# Patient Record
Sex: Female | Born: 1960 | Race: Black or African American | Hispanic: No | Smoking: Never smoker
Health system: Southern US, Community
[De-identification: ages and names within clinical notes are randomized; demographics above are authoritative.]

## PROBLEM LIST (undated history)

## (undated) DIAGNOSIS — E119 Type 2 diabetes mellitus without complications: Secondary | ICD-10-CM

## (undated) DIAGNOSIS — I10 Essential (primary) hypertension: Secondary | ICD-10-CM

## (undated) HISTORY — DX: Type 2 diabetes mellitus without complications: E11.9

---

## 2016-12-07 DIAGNOSIS — M75101 Unspecified rotator cuff tear or rupture of right shoulder, not specified as traumatic: Secondary | ICD-10-CM | POA: Insufficient documentation

## 2016-12-07 DIAGNOSIS — M12811 Other specific arthropathies, not elsewhere classified, right shoulder: Secondary | ICD-10-CM | POA: Insufficient documentation

## 2017-02-14 DIAGNOSIS — Z471 Aftercare following joint replacement surgery: Secondary | ICD-10-CM | POA: Insufficient documentation

## 2017-02-14 DIAGNOSIS — Z96611 Presence of right artificial shoulder joint: Secondary | ICD-10-CM

## 2017-02-14 DIAGNOSIS — Z9660 Presence of unspecified orthopedic joint implant: Secondary | ICD-10-CM | POA: Insufficient documentation

## 2017-03-04 ENCOUNTER — Ambulatory Visit: Payer: Medicaid Other | Admitting: Podiatry

## 2017-03-15 ENCOUNTER — Ambulatory Visit: Payer: Medicaid Other | Admitting: Podiatry

## 2017-03-15 DIAGNOSIS — M7542 Impingement syndrome of left shoulder: Secondary | ICD-10-CM | POA: Insufficient documentation

## 2017-03-23 ENCOUNTER — Ambulatory Visit (INDEPENDENT_AMBULATORY_CARE_PROVIDER_SITE_OTHER): Payer: Medicaid Other

## 2017-03-23 ENCOUNTER — Ambulatory Visit (INDEPENDENT_AMBULATORY_CARE_PROVIDER_SITE_OTHER): Payer: Medicaid Other | Admitting: Podiatry

## 2017-03-23 ENCOUNTER — Ambulatory Visit: Payer: Medicaid Other

## 2017-03-23 DIAGNOSIS — S9032XA Contusion of left foot, initial encounter: Secondary | ICD-10-CM

## 2017-03-23 DIAGNOSIS — S9031XA Contusion of right foot, initial encounter: Secondary | ICD-10-CM

## 2017-03-23 DIAGNOSIS — M722 Plantar fascial fibromatosis: Secondary | ICD-10-CM

## 2017-03-23 DIAGNOSIS — M67471 Ganglion, right ankle and foot: Secondary | ICD-10-CM

## 2017-03-23 NOTE — Progress Notes (Signed)
   Subjective:    Patient ID: Deanna Fuller, female    DOB: [birth year], 56 y.o.   MRN: [id number]  HPI: She presents today with chief complaint of pain to the dorsal aspect of the right foot times about 2 months now states that she had dropped a laptop on the top of her foot 2 months ago went to [facility] they state there was nothing broken but has since developed a painful nodule to the top. She states that she also has medial longitudinal arch pain and lateral foot pain. She states there is some discomfort with walking. She's not taking medications.  Review of Systems     Objective:   Physical Exam: Vital signs are stable alert and oriented 3. Pulses are palpable. Neurologic sensorium is intact. Deep tendon reflexes are intact. Muscle strength is 5 over 5 dorsally splint flexors and inverters everters on physical musculatures intact. Orthopedic evaluation demonstrates pain on palpation fluctuant nodule to the dorsal aspect at the base of the second metatarsal intermediate cuneiform joint. This measures about 1 cm in diameter and appears to be nonpulsatile and appears to be fluctuant like a ganglion cyst. Radiographs do not demonstrate any type of osseus abnormalities in this area on what appears to be a possible healing fracture at the base of the second metatarsal this is very discrete and most likely would not have seen it on general radiography initially. She does have pain on palpation medial calcaneal tubercle associate with a soft tissue increased density at the plantar fascia insertion site indicative of plantar fasciitis.      Assessment & Plan:  Assessment: Contusion possible healing fracture second metatarsal base right foot ganglion cyst dorsal aspect right foot plantar fasciitis right foot.  Plan: I injected the right heel today with a long local anesthetic discussed the possible need for aspiration or surgical excision of the ganglion. She understands is amenable to it all  follow up with me on an as-needed basis to discuss these items.

## 2017-05-10 DIAGNOSIS — M75121 Complete rotator cuff tear or rupture of right shoulder, not specified as traumatic: Secondary | ICD-10-CM | POA: Insufficient documentation

## 2018-01-23 DIAGNOSIS — M7520 Bicipital tendinitis, unspecified shoulder: Secondary | ICD-10-CM | POA: Insufficient documentation

## 2018-02-01 ENCOUNTER — Encounter: Payer: Self-pay | Admitting: Podiatry

## 2018-02-01 ENCOUNTER — Ambulatory Visit: Payer: Medicaid Other | Admitting: Podiatry

## 2018-02-01 DIAGNOSIS — S90222A Contusion of left lesser toe(s) with damage to nail, initial encounter: Secondary | ICD-10-CM | POA: Diagnosis not present

## 2018-02-01 DIAGNOSIS — L603 Nail dystrophy: Secondary | ICD-10-CM

## 2018-02-01 NOTE — Patient Instructions (Signed)

## 2018-02-01 NOTE — Progress Notes (Signed)
  Subjective:  Patient ID: Deanna Fuller, female    DOB: [birth year],  MRN: [id number] HPI Chief Complaint  Patient presents with  . Nail Problem    Hallux nail right   "I have a thick, dark nail that I just noticed after taking off my nail polish"  . Toe Pain    2nd toe left - stumped toe and darkened area at base of nail    57 y.o. female presents with the above complaint.   ROS: Denies fever chills nausea vomiting muscle aches pains calf pain back pain chest pain shortness of breath.  No past medical history on file.   Current Outpatient Medications:  .  ACCU-CHEK FASTCLIX LANCETS MISC, U TO TEST BLOOD SUGAR TID UTD, Disp: , Rfl: 3 .  ACCU-CHEK GUIDE test strip, TEST BLOOD SUGAR TID UTD, Disp: , Rfl: 2 .  atorvastatin (LIPITOR) 20 MG tablet, TK 1 T PO HS FOR HIGH CHOLESTEROL, Disp: , Rfl: 3 .  lisinopril (PRINIVIL,ZESTRIL) 10 MG tablet, lisinopril 10 mg tablet  TK 1 T PO D FOR HIGH BP, Disp: , Rfl:  .  meloxicam (MOBIC) 15 MG tablet, TK 1 T PO QD, Disp: , Rfl: 2 .  metFORMIN (GLUCOPHAGE) 500 MG tablet, Take by mouth., Disp: , Rfl:  .  NIFEdipine (PROCARDIA-XL/ADALAT CC) 60 MG 24 hr tablet, Take by mouth., Disp: , Rfl:  .  PREVIDENT 5000 SENSITIVE 1.1-5 % PSTE, , Disp: , Rfl: 3  Allergies  Allergen Reactions  . No Known Allergies    Review of Systems Objective:  There were no vitals filed for this visit.  General: Well developed, nourished, in no acute distress, alert and oriented x3   Dermatological: Skin is warm, dry and supple bilateral. Nails x 10 are well maintained; remaining integument appears unremarkable at this time. There are no open sores, no preulcerative lesions, no rash or signs of infection present.  Thick soft thick hallux nail plate left.  Discoloration.  Subungual hematoma second left.  Vascular: Dorsalis Pedis artery and Posterior Tibial artery pedal pulses are 2/4 bilateral with immedate capillary fill time. Pedal hair growth present. No varicosities  and no lower extremity edema present bilateral.   Neruologic: Grossly intact via light touch bilateral. Vibratory intact via tuning fork bilateral. Protective threshold with Semmes Wienstein monofilament intact to all pedal sites bilateral. Patellar and Achilles deep tendon reflexes 2+ bilateral. No Babinski or clonus noted bilateral.   Musculoskeletal: No gross boney pedal deformities bilateral. No pain, crepitus, or limitation noted with foot and ankle range of motion bilateral. Muscular strength 5/5 in all groups tested bilateral.  Gait: Unassisted, Nonantalgic.    Radiographs:  None taken  Assessment & Plan:   Assessment: Nail dystrophy  Plan: Samples were taken today for pathologic evaluation.     Max T. Newberg, [place]

## 2018-03-06 ENCOUNTER — Encounter: Payer: Self-pay | Admitting: Podiatry

## 2018-03-06 ENCOUNTER — Ambulatory Visit: Payer: Medicaid Other | Admitting: Podiatry

## 2018-03-06 DIAGNOSIS — S90222S Contusion of left lesser toe(s) with damage to nail, sequela: Secondary | ICD-10-CM | POA: Diagnosis not present

## 2018-03-06 DIAGNOSIS — L603 Nail dystrophy: Secondary | ICD-10-CM

## 2018-03-06 DIAGNOSIS — Z79899 Other long term (current) drug therapy: Secondary | ICD-10-CM

## 2018-03-06 MED ORDER — TERBINAFINE HCL 250 MG PO TABS: 250.0000 mg | ORAL_TABLET | Freq: Every day | ORAL | 0 refills | Status: DC

## 2018-03-06 NOTE — Patient Instructions (Signed)

## 2018-03-06 NOTE — Progress Notes (Signed)
She presents today for follow-up of her pathology reports regarding hallux nail plate right.  Objective: Vital signs are stable she is alert and oriented x3.  Pulses are palpable.  Pathology report does demonstrate onychomycosis with a saprophytic infection.  Assessment: Onychomycosis.  Plan: After thorough discussion the pros and cons of oral therapy we decided to start with terbinafine.  She will start with 250 mg tablets 1 p.o. daily and blood work will be performed today.  Should this come back abnormal we will notify her immediately.  Otherwise I will follow-up with her in 1 month.  We discussed taking at least 2 doses of the medication to make sure that the nail will start to grow out if it does not then we will consider nail removal.

## 2018-03-07 ENCOUNTER — Telehealth: Payer: Self-pay | Admitting: *Deleted

## 2018-03-07 LAB — HEPATIC FUNCTION PANEL
ALK PHOS: 77 IU/L (ref 39–117)
ALT: 15 IU/L (ref 0–32)
AST: 23 IU/L (ref 0–40)
Albumin: 5.1 g/dL (ref 3.5–5.5)
BILIRUBIN, DIRECT: 0.11 mg/dL (ref 0.00–0.40)
Bilirubin Total: 0.3 mg/dL (ref 0.0–1.2)
Total Protein: 7.9 g/dL (ref 6.0–8.5)

## 2018-03-07 NOTE — Telephone Encounter (Signed)
Left message informing pt of Dr. Hyatt's review of results and orders. 

## 2018-03-07 NOTE — Telephone Encounter (Signed)
-----   Message from Garrel Ridgel, [place] sent at 03/07/2018  6:47 AM EDT ----- Blood work looks good and may continue medication.

## 2018-03-22 ENCOUNTER — Telehealth: Payer: Self-pay | Admitting: Podiatry

## 2018-03-22 NOTE — Telephone Encounter (Signed)
I have an appointment on 19 August for a medication follow up. I was wondering if I could get labs ordered to have done before I come in for that appointment so he can check my liver and kidney functions. My number is [phone]. Thank you.

## 2018-03-23 NOTE — Telephone Encounter (Signed)
I tried to call patient back on all numbers listed in her chart, but they all stated "international calls are barred and hangs up"  Unable to reach patient by phone

## 2018-03-27 DIAGNOSIS — M7581 Other shoulder lesions, right shoulder: Secondary | ICD-10-CM | POA: Insufficient documentation

## 2018-04-03 ENCOUNTER — Ambulatory Visit: Payer: Medicaid Other | Admitting: Podiatry

## 2018-04-03 ENCOUNTER — Encounter: Payer: Self-pay | Admitting: Podiatry

## 2018-04-03 DIAGNOSIS — Z79899 Other long term (current) drug therapy: Secondary | ICD-10-CM

## 2018-04-03 MED ORDER — TERBINAFINE HCL 250 MG PO TABS: 250.0000 mg | ORAL_TABLET | Freq: Every day | ORAL | 0 refills | Status: DC

## 2018-04-03 NOTE — Progress Notes (Signed)
She presents today for follow-up of her nail dystrophy.  She denies fever chills nausea vomiting muscle aches pains itching or rashes.  She states that the medication did make her just a little bit nauseous occasionally but she is going to try eating something with it instead of taking it on empty stomach.  Objective: Vital signs are stable alert and oriented x3.  Pulses are palpable.  Nail dystrophy cannot rule out onychomycosis hallux right.  Plan: Dispensed a prescription for 90 days of medication and requested a liver profile.  Follow-up with her in 3 to 4 months

## 2018-04-04 LAB — HEPATIC FUNCTION PANEL
ALK PHOS: 81 IU/L (ref 39–117)
ALT: 21 IU/L (ref 0–32)
AST: 32 IU/L (ref 0–40)
Albumin: 4.8 g/dL (ref 3.5–5.5)
BILIRUBIN TOTAL: 0.5 mg/dL (ref 0.0–1.2)
BILIRUBIN, DIRECT: 0.14 mg/dL (ref 0.00–0.40)
Total Protein: 7.7 g/dL (ref 6.0–8.5)

## 2018-04-05 ENCOUNTER — Telehealth: Payer: Self-pay | Admitting: *Deleted

## 2018-04-05 NOTE — Telephone Encounter (Signed)
-----   Message from Garrel Ridgel, [place] sent at 04/04/2018  7:12 AM EDT ----- Blood work looks great and may continue with medication.

## 2018-04-05 NOTE — Telephone Encounter (Signed)
Left message on pt's home and mobile phone informing her of Dr. Stephenie Acres review of results and orders.

## 2018-05-09 DIAGNOSIS — M19011 Primary osteoarthritis, right shoulder: Secondary | ICD-10-CM | POA: Insufficient documentation

## 2018-07-24 ENCOUNTER — Ambulatory Visit: Payer: Medicaid Other | Admitting: Podiatry

## 2018-07-24 ENCOUNTER — Encounter: Payer: Self-pay | Admitting: Podiatry

## 2018-07-24 DIAGNOSIS — L603 Nail dystrophy: Secondary | ICD-10-CM

## 2018-07-24 NOTE — Progress Notes (Signed)
She presents today for follow-up of her 120 days of Lamisil.  Denies fever chills nausea vomiting muscle aches pains or rashes.  Had no problems taking the medication.  She states that her toenail is looking much better she refers to the hallux right.  Objective: There is no erythema edema saline strange odor toenail appears to be growing out very nicely is approximately 50% grown out and clear at this point hallux right.  Assessment: Long-term therapy with Lamisil.  Wrote another 30 tablets.  She will take 1 tablet every other day for the next 2 months I will follow-up with her in 3 months.

## 2018-08-04 DIAGNOSIS — M25569 Pain in unspecified knee: Secondary | ICD-10-CM | POA: Insufficient documentation

## 2018-08-04 DIAGNOSIS — M25519 Pain in unspecified shoulder: Secondary | ICD-10-CM | POA: Insufficient documentation

## 2018-08-04 DIAGNOSIS — M19019 Primary osteoarthritis, unspecified shoulder: Secondary | ICD-10-CM | POA: Insufficient documentation

## 2018-09-19 ENCOUNTER — Ambulatory Visit: Payer: Medicaid Other | Attending: Orthopedic Surgery | Admitting: Physical Therapy

## 2018-09-19 DIAGNOSIS — G8929 Other chronic pain: Secondary | ICD-10-CM | POA: Diagnosis present

## 2018-09-19 DIAGNOSIS — M25511 Pain in right shoulder: Secondary | ICD-10-CM | POA: Insufficient documentation

## 2018-09-19 DIAGNOSIS — M19011 Primary osteoarthritis, right shoulder: Secondary | ICD-10-CM | POA: Diagnosis not present

## 2018-09-30 ENCOUNTER — Encounter: Payer: Self-pay | Admitting: Physical Therapy

## 2018-09-30 NOTE — Therapy (Signed)
[facility] [address] [place], [place], [address] Phone: [phone]   Fax:  [phone]  Physical Therapy Evaluation  Patient Details  Name: Deanna Fuller MRN: [id number] Date of Birth: [birth year] Referring Provider (PT): Dr. Harlow Mares   Encounter Date: 09/19/2018  PT End of Session - 09/30/18 1425    Visit Number  1    Number of Visits  1    Date for PT Re-Evaluation  09/20/18    PT Start Time  6387    PT Stop Time  1017    PT Time Calculation (min)  93 min    Activity Tolerance  Patient tolerated treatment well    Behavior During Therapy  [facility] for tasks assessed/performed       History reviewed. No pertinent past medical history.  History reviewed. No pertinent surgical history.  There were no vitals filed for this visit.   Subjective Assessment - 09/30/18 1423    Subjective  See FCE report.  No c/o shoulder pain at this time.      Patient Stated Goals  FCE only         [facility] PT Assessment - 09/30/18 0001      Assessment   Medical Diagnosis  R shoulder osteoarthritis/ R shoulder pain    Referring Provider (PT)  Dr. Harlow Mares    Onset Date/Surgical Date  02/03/17    Prior Therapy  yes      Prior Function   Level of Independence  Independent      Cognition   Overall Cognitive Status  Within Functional Limits for tasks assessed             Plan - 09/30/18 1425    Clinical Impression Statement  Overall Level of Work: Falls within the Light range.  Exerting up to 20 pounds of force occasionally, and/or up to 10 pounds of force frequently, and/or a negligible amount of force constantly (Constantly: activity or condition exist 2/3 or more of the time) to move objects.  Physical demand requirements are in excess of those for Sedentary Work.  Even though the weight lifted may be only a negligible amount, a job should be rated Light Work: (1) when it requires walking or standing to significant degree; or (2) when it  requires sitting most of the time but entails pushing and/or pulling of arm or leg controls; and/or (3) when the job requires working at a production rate pace entailing the constant pushing and/or pulling of materials even though the weight of those materials is negligible.  NOTE: The constant stress and strain of maintaining a production rate pace, especially in an industrial setting, can be and is physically demanding of a worker even though the amount of force exerted is negligible.  Please note that the dynamic strength/manual materials handling section of the report indicates a higher level of work than that determined by considering the client's performance on the entire test.  In this case, the overall level of work was significantly influenced by elevated heart rates in the Mobility section of the test. Our research shows that the safe, overall level of work is significantly influenced by non-materials handling (i.e. position tolerance and mobility) abilities.  To ignore these non-materials handling demands, negatively impacts the validity of the test.    Please see the Task Performance Table for specific abilities.  Based on the individual task scores in Dynamic Strength, Position Tolerance and Mobility, the client is able to tolerate the Light level  of work for the 8-hour day/40-hour week.      Clinical Presentation  Stable    Clinical Decision Making  Low    Rehab Potential  Good    PT Frequency  1x / week    PT Treatment/Interventions  ADLs/Self Care Home Management;Functional mobility training;Therapeutic activities;Therapeutic exercise;Neuromuscular re-education;Patient/family education    PT Next Visit Plan  FCE only       Patient will benefit from skilled therapeutic intervention in order to improve the following deficits and impairments:  Pain, Impaired UE functional use, Decreased strength, Decreased mobility  Visit Diagnosis: Osteoarthritis of right shoulder, unspecified  osteoarthritis type  Chronic right shoulder pain     Problem List Patient Active Problem List   Diagnosis Date Noted  . Impingement syndrome of left shoulder 03/15/2017  . 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 Joint replaced 02/14/2017  . Rotator cuff tear arthropathy of right shoulder 12/07/2016   Pura Spice, PT, DPT # [phone] 09/30/2018, 2:29 PM  [facility] [address] [place], [place], [address] Phone: [phone]   Fax:  [phone]  Name: Angila Wombles MRN: [id number] Date of Birth: [birth year]

## 2018-10-23 ENCOUNTER — Ambulatory Visit: Payer: Medicaid Other | Admitting: Podiatry

## 2018-10-25 ENCOUNTER — Other Ambulatory Visit: Payer: Self-pay

## 2018-10-25 ENCOUNTER — Ambulatory Visit: Payer: Medicaid Other | Admitting: Podiatry

## 2018-10-25 ENCOUNTER — Encounter: Payer: Self-pay | Admitting: Podiatry

## 2018-10-25 DIAGNOSIS — L603 Nail dystrophy: Secondary | ICD-10-CM

## 2018-10-25 MED ORDER — TERBINAFINE HCL 250 MG PO TABS: 250.0000 mg | ORAL_TABLET | Freq: Every day | ORAL | 0 refills | Status: DC

## 2018-10-25 NOTE — Progress Notes (Signed)
She presents today for follow-up of her nail fungus.  States that she completed 120 days +1 every other day dosing cycle states that is getting better except for that one spot in the corner.  Objective: She denies fever chills nausea body muscle aches pains calf pain back pain chest pain shortness of breath.  She has no pain on palpation of the toes.  Hallux nail appears to be growing very nicely.  There is no erythema edema cellulitis drainage odor she does a sharp incurvated nail margin with some hyperpigmentation which may need to be removed at some point.  Assessment: Well-healing onychomycosis.  Plan: I will like to follow-up with her in about 3 months and I would like for her to continue an every other day dosing of the Lamisil which was prescribed today.

## 2019-01-24 ENCOUNTER — Ambulatory Visit: Payer: Medicaid Other | Admitting: Podiatry

## 2019-01-24 ENCOUNTER — Encounter: Payer: Self-pay | Admitting: Podiatry

## 2019-01-24 ENCOUNTER — Other Ambulatory Visit: Payer: Self-pay

## 2019-01-24 VITALS — Temp 96.4°F

## 2019-01-24 DIAGNOSIS — L600 Ingrowing nail: Secondary | ICD-10-CM

## 2019-01-24 MED ORDER — NEOMYCIN-POLYMYXIN-HC 1 % OT SOLN: OTIC | 1 refills | Status: DC

## 2019-01-24 MED ORDER — TERBINAFINE HCL 250 MG PO TABS: 250.0000 mg | ORAL_TABLET | Freq: Every day | ORAL | 0 refills | Status: DC

## 2019-01-24 NOTE — Progress Notes (Signed)
She presents today chief complaint of ingrown toenail tibial border of the hallux right.  States that after the Lamisil it seems to be growing out quite nicely.  Seems like there is a small amount left right here as she points to the medial border of the hallux right.  Objective: Vital signs are stable she is alert and oriented x3.  Pulses are palpable.  She has pain on palpation with mild erythema along the tibial border of the hallux right.  There is no purulence no malodor.  The majority of her fungus has grown out nicely.  Assessment: Resolving onychomycosis.  Ingrown toenail tibial border hallux right.  Plan: Discussed etiology pathology conservative surgical therapies were going to continue the use of Lamisil 1 tablet every other day for the next 2 months.  I went ahead and performed a chemical matrixectomy today along the tibial border of the hallux right.  She tolerated procedure well after local anesthesia was administered she was provided with both oral and written home-going instructions for the care and soaking of the toe as well as a prescription for Corticosporin otic to be applied twice daily.  I will follow-up with her in 2 weeks to reevaluate the toe she will continue the use of the Lamisil.

## 2019-01-24 NOTE — Patient Instructions (Addendum)
Betadine Soak Instructions  Purchase an 8 oz. bottle of BETADINE solution (Povidone)  THE DAY AFTER THE PROCEDURE  Place 1 tablespoon of betadine solution in a quart of warm tap water.  Submerge your foot or feet with outer bandage intact for the initial soak; this will allow the bandage to become moist and wet for easy lift off.  Once you remove your bandage, continue to soak in the solution for 20 minutes.  This soak should be done twice a day.  Next, remove your foot or feet from solution, blot dry the affected area and cover.  You may use a band aid large enough to cover the area or use gauze and tape.  Apply other medications to the area as directed by the doctor such as cortisporin otic solution (ear drops) or neosporin.  IF YOUR SKIN BECOMES IRRITATED WHILE USING THESE INSTRUCTIONS, IT IS OKAY TO SWITCH TO EPSOM SALTS AND WATER OR WHITE VINEGAR AND WATER.   [facility] Instructions-Post Nail Surgery  You have had your ingrown toenail and root treated with a chemical.  This chemical causes a burn that will drain and ooze like a blister.  This can drain for 6-8 weeks or longer.  It is important to keep this area clean, covered, and follow the soaking instructions dispensed at the time of your surgery.  This area will eventually dry and form a scab.  Once the scab forms you no longer need to soak or apply a dressing.  If at any time you experience an increase in pain, redness, swelling, or drainage, you should contact the office as soon as possible.    Dr. Milinda Pointer has sent over a refill for Lamisil to your pharmacy today. The instructions on your bottle will say "take 1 tablet daily", however, he would like for you to take one pill every other day. He will follow up with you in 3 months to re-evaluate your toenails.

## 2019-02-07 ENCOUNTER — Ambulatory Visit: Payer: Medicaid Other | Admitting: Podiatry

## 2019-02-07 ENCOUNTER — Other Ambulatory Visit: Payer: Self-pay

## 2019-02-07 ENCOUNTER — Encounter: Payer: Self-pay | Admitting: Podiatry

## 2019-02-07 VITALS — Temp 97.4°F

## 2019-02-07 DIAGNOSIS — Z9889 Other specified postprocedural states: Secondary | ICD-10-CM

## 2019-02-07 DIAGNOSIS — L600 Ingrowing nail: Secondary | ICD-10-CM

## 2019-02-07 NOTE — Progress Notes (Signed)
She presents today for follow-up of a nail procedure hallux right.  States that is doing a lot better does not bother me at all anymore.  Objective: Vital signs are stable she is alert and oriented x3 there is no erythema edema cellulitis drainage or odor of the nail margin appears to be healing very nicely to the right hallux.  Assessment: Well-healing surgical foot.  Plan: I recommended she continue soaking at least every other day for about the next week or so and cover during the daytime with a Band-Aid but leave it open at bedtime.  Should this start to become more painful or red or draining she is to notify us immediately.

## 2019-04-11 ENCOUNTER — Ambulatory Visit: Payer: Medicaid Other | Admitting: Podiatry

## 2019-04-11 ENCOUNTER — Other Ambulatory Visit: Payer: Self-pay

## 2019-04-11 ENCOUNTER — Encounter: Payer: Self-pay | Admitting: Podiatry

## 2019-04-11 DIAGNOSIS — B351 Tinea unguium: Secondary | ICD-10-CM | POA: Diagnosis not present

## 2019-04-11 DIAGNOSIS — L603 Nail dystrophy: Secondary | ICD-10-CM

## 2019-04-11 MED ORDER — TERBINAFINE HCL 250 MG PO TABS: 250.0000 mg | ORAL_TABLET | Freq: Every day | ORAL | 0 refills | Status: DC

## 2019-04-11 NOTE — Progress Notes (Signed)
She presents today for a follow-up visit regarding her onychomycosis.  She states that she is finished up her Lamisil every other day approximately 1 month ago states that the toenail seems to be growing out nicely she has just a small corner to resolve.  She states that the matrixectomy has healed well with no problems.  Objective: Vital signs are stable alert and oriented x3.  Pulses are palpable.  Neurologic sensorium is intact deep tendon reflexes are intact muscle strength is normal symmetrical.  Nail plate appears to be healing very nicely she has tibial border of the hallux right distalmost aspect approximately the last 3 to 4 mm to resolve.  Assessment: Well-healing onychomycosis.  Plan: At this point since she had no problems taking medication I will put her on Lamisil 30 tablets 1 every other day and I will follow-up with her in 3 months she will call with questions or concerns

## 2019-06-20 ENCOUNTER — Ambulatory Visit: Payer: Medicaid Other | Admitting: Podiatry

## 2019-06-20 ENCOUNTER — Other Ambulatory Visit: Payer: Self-pay

## 2019-06-20 ENCOUNTER — Encounter: Payer: Self-pay | Admitting: Podiatry

## 2019-06-20 DIAGNOSIS — L603 Nail dystrophy: Secondary | ICD-10-CM

## 2019-06-20 NOTE — Progress Notes (Signed)
She presents today for follow-up of her onychomycosis.  She states that the corners are still dark thus the only part that I do not like but the rest of my nails look great.  Objective: Vital signs are stable alert and oriented x3.  Pulses are palpable.  Very little change in the way of the tibial border of the hallux bilaterally.  There is still a dark stripe there but she also has a central stripe which is a Retail [employer] onychia.  Assessment: Resolving onychomycosis.  Plan: Discontinue therapy.

## 2019-11-13 DIAGNOSIS — M179 Osteoarthritis of knee, unspecified: Secondary | ICD-10-CM | POA: Insufficient documentation

## 2021-01-20 ENCOUNTER — Other Ambulatory Visit: Payer: Self-pay | Admitting: Physician Assistant

## 2021-01-20 DIAGNOSIS — Z1231 Encounter for screening mammogram for malignant neoplasm of breast: Secondary | ICD-10-CM

## 2021-02-04 ENCOUNTER — Ambulatory Visit
Admission: RE | Admit: 2021-02-04 | Discharge: 2021-02-04 | Disposition: A | Discharge status: Home / Self Care | Payer: Medicaid Other | Source: Ambulatory Visit | Attending: Physician Assistant | Admitting: Physician Assistant

## 2021-02-04 ENCOUNTER — Other Ambulatory Visit: Payer: Self-pay

## 2021-02-04 DIAGNOSIS — Z1231 Encounter for screening mammogram for malignant neoplasm of breast: Secondary | ICD-10-CM

## 2021-02-06 ENCOUNTER — Inpatient Hospital Stay
Admission: RE | Admit: 2021-02-06 | Discharge: 2021-02-06 | Disposition: A | Discharge status: Home / Self Care | Payer: Self-pay | Source: Ambulatory Visit | Attending: *Deleted | Admitting: *Deleted

## 2021-02-06 ENCOUNTER — Other Ambulatory Visit: Payer: Self-pay | Admitting: *Deleted

## 2021-02-06 DIAGNOSIS — Z1231 Encounter for screening mammogram for malignant neoplasm of breast: Secondary | ICD-10-CM

## 2022-04-05 DIAGNOSIS — M2550 Pain in unspecified joint: Secondary | ICD-10-CM | POA: Insufficient documentation

## 2022-04-15 DIAGNOSIS — M7918 Myalgia, other site: Secondary | ICD-10-CM | POA: Insufficient documentation

## 2022-08-02 ENCOUNTER — Ambulatory Visit (INDEPENDENT_AMBULATORY_CARE_PROVIDER_SITE_OTHER): Payer: Medicaid Other | Admitting: Podiatry

## 2022-08-02 ENCOUNTER — Encounter: Payer: Self-pay | Admitting: Podiatry

## 2022-08-02 DIAGNOSIS — D2372 Other benign neoplasm of skin of left lower limb, including hip: Secondary | ICD-10-CM | POA: Diagnosis not present

## 2022-08-02 DIAGNOSIS — M2042 Other hammer toe(s) (acquired), left foot: Secondary | ICD-10-CM | POA: Diagnosis not present

## 2022-08-02 DIAGNOSIS — M7752 Other enthesopathy of left foot: Secondary | ICD-10-CM

## 2022-08-02 MED ORDER — DEXAMETHASONE SODIUM PHOSPHATE 120 MG/30ML IJ SOLN
2.0000 mg | Freq: Once | INTRAMUSCULAR | Status: AC
  Administered 2022-08-02: 2 mg via INTRA_ARTICULAR

## 2022-08-02 NOTE — Progress Notes (Signed)
She presents today states that she has had a painful corn beneath the fifth toe area for the past several months day treatment when she gets pedicures but it really rubs in her shoes.  Objective: Vital signs are stable alert and oriented x 3.  Pulses are palpable.  Reactive hyperkeratotic lesion with a area of fluctuance beneath the lesion demonstrated around the fifth toe.  Assessment bursitis and benign skin lesion.  Plan: Injected 2 mg of dexamethasone and debrided the benign skin lesions today.  Will follow-up with her on an as-needed basis.

## 2022-09-01 ENCOUNTER — Other Ambulatory Visit: Payer: Self-pay | Admitting: Student

## 2022-09-01 DIAGNOSIS — G8929 Other chronic pain: Secondary | ICD-10-CM

## 2022-09-14 ENCOUNTER — Encounter: Payer: Self-pay | Admitting: Student

## 2022-09-14 IMAGING — MG MM DIGITAL SCREENING BILAT W/ TOMO AND CAD
8 series · 8 of 24 positions shown · non-contrast
Comparison: Previous exam(s).

CLINICAL DATA: Screening.

EXAM:
DIGITAL SCREENING BILATERAL MAMMOGRAM WITH TOMOSYNTHESIS AND CAD
TECHNIQUE: Bilateral screening digital craniocaudal and mediolateral oblique
mammograms were obtained. Bilateral screening digital breast
tomosynthesis was performed. The images were evaluated with
computer-aided detection.

[R CC synth-2D]
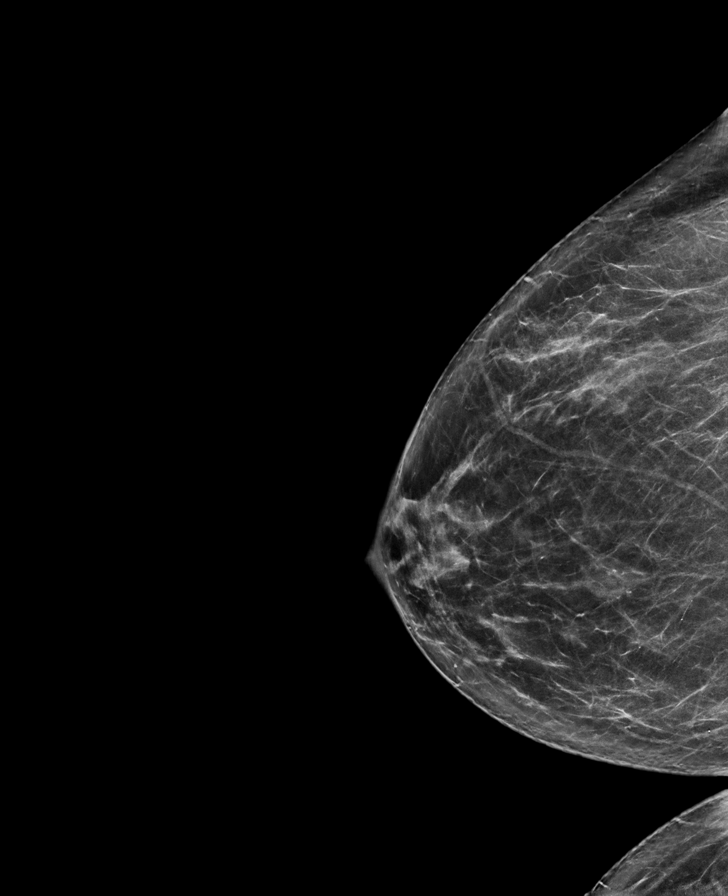

[L MLO synth-2D]
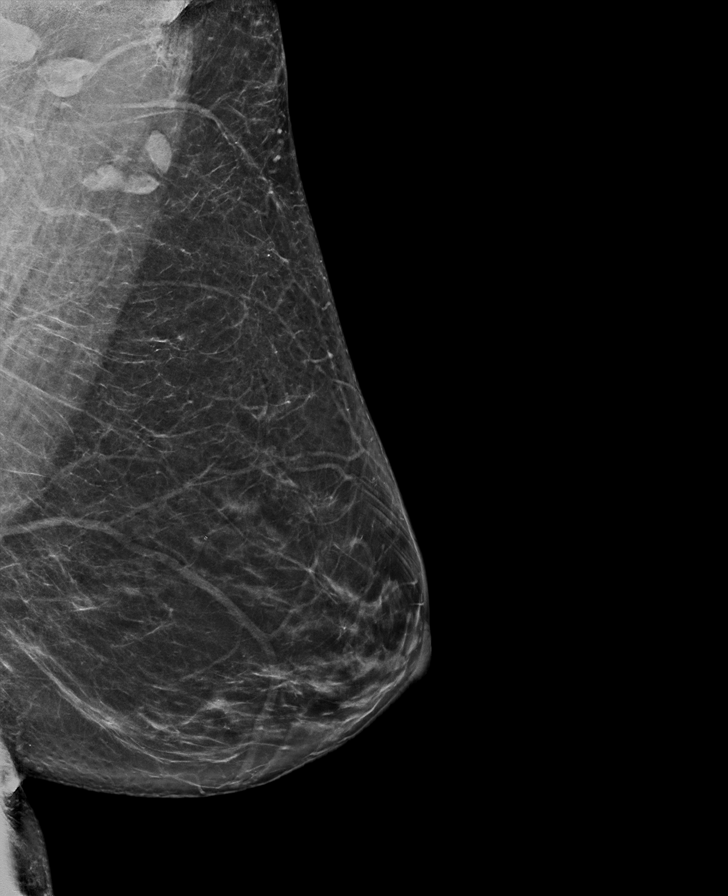

[R MLO synth-2D]
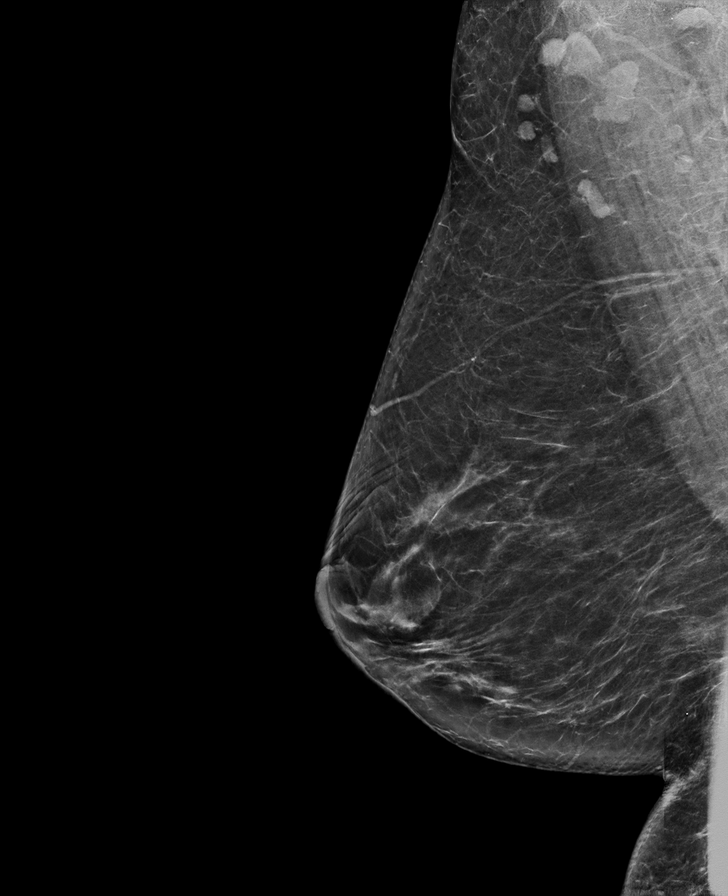

[L CC synth-2D]
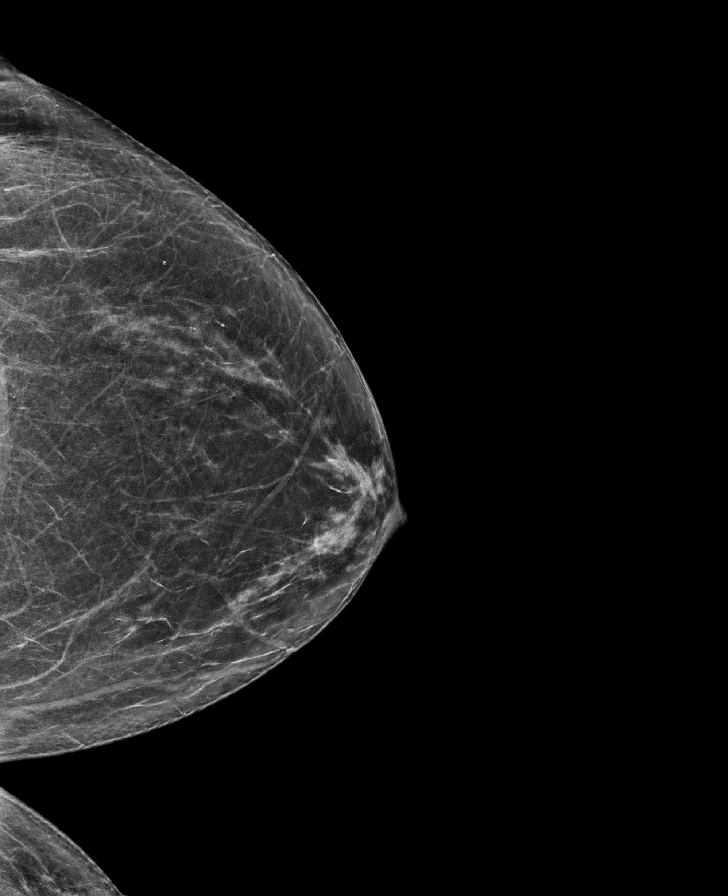

[R CC tomo · tomo slice 35/70.0]
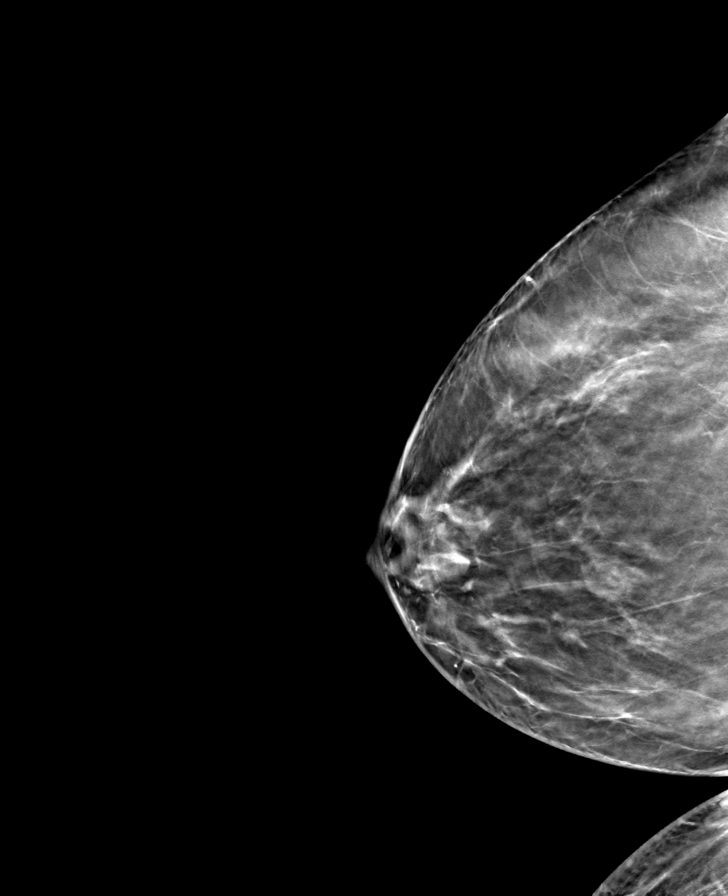

[R MLO tomo · tomo slice 39/78.0]
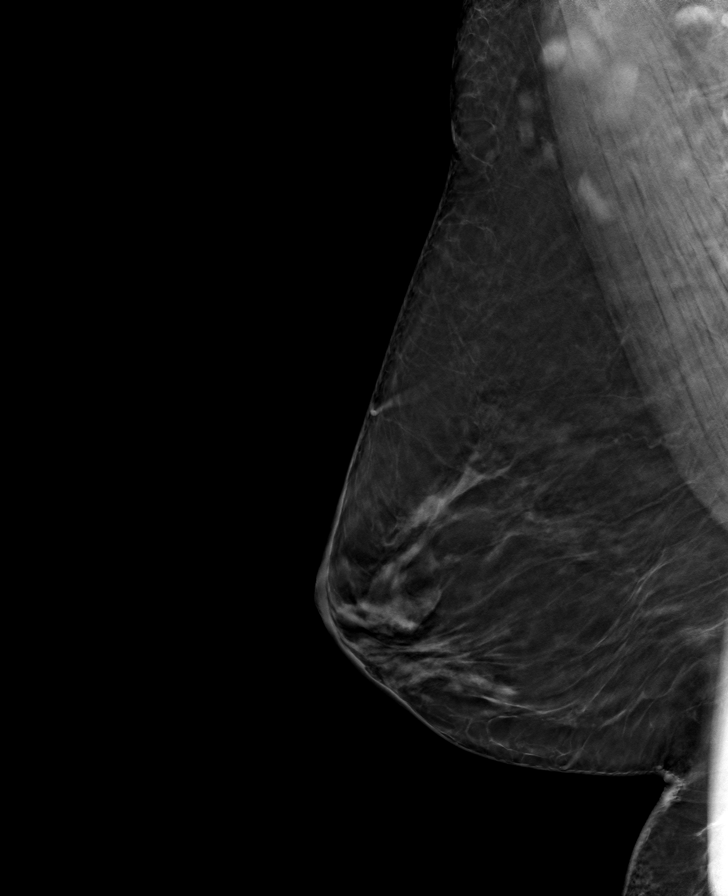

[L CC tomo · tomo slice 35/70.0]
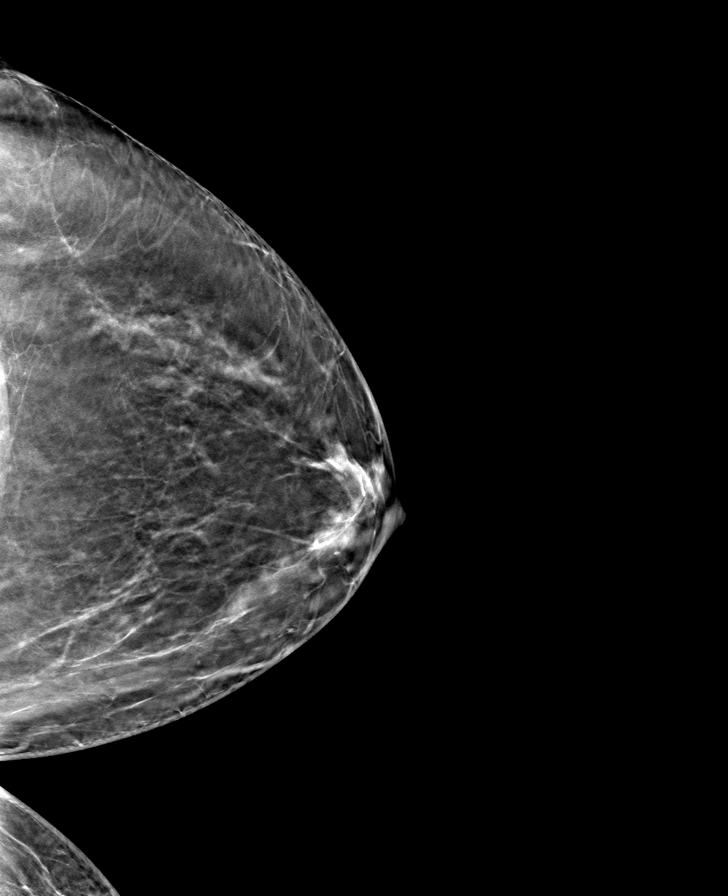

[L MLO tomo · tomo slice 39/76.0]
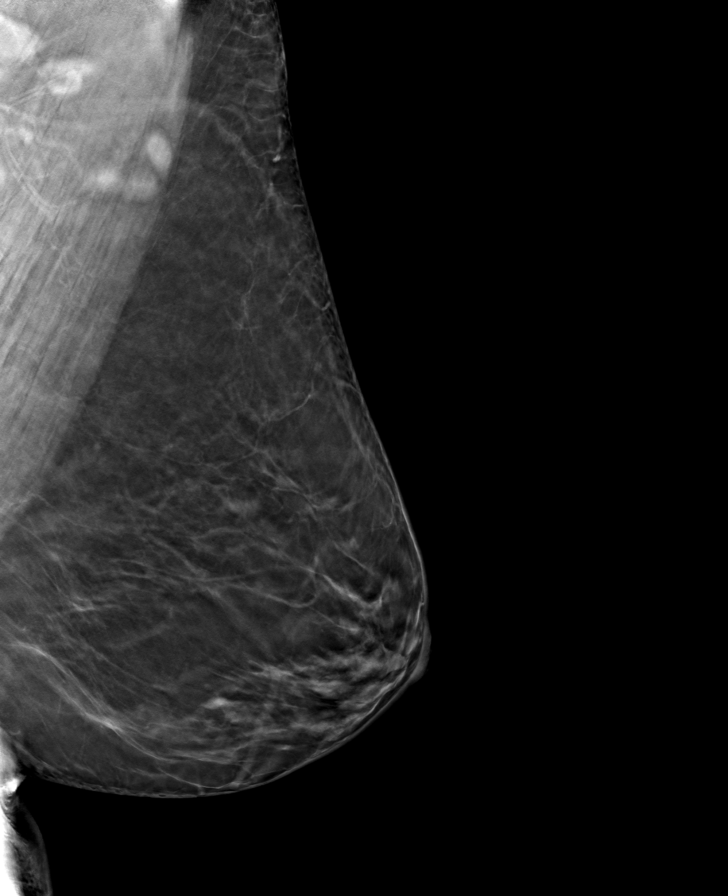

[8 of 24 positions shown; findings below may reference images not displayed]

ACR Breast Density Category b: There are scattered areas of
fibroglandular density.
FINDINGS: There are no findings suspicious for malignancy.
IMPRESSION: No mammographic evidence of malignancy. A result letter of this
screening mammogram will be mailed directly to the patient.

RECOMMENDATION:
Screening mammogram in one year. (Code:51-O-LD2)

BI-RADS CATEGORY  1: Negative.

## 2022-09-15 ENCOUNTER — Ambulatory Visit
Admission: RE | Admit: 2022-09-15 | Discharge: 2022-09-15 | Disposition: A | Discharge status: Home / Self Care | Payer: Medicaid Other | Source: Ambulatory Visit | Attending: Student | Admitting: Student

## 2022-09-15 ENCOUNTER — Encounter: Payer: Self-pay | Admitting: Student

## 2022-09-15 DIAGNOSIS — G8929 Other chronic pain: Secondary | ICD-10-CM

## 2024-02-14 ENCOUNTER — Other Ambulatory Visit: Payer: Self-pay

## 2024-02-14 ENCOUNTER — Emergency Department
Admission: EM | Admit: 2024-02-14 | Discharge: 2024-02-14 | Disposition: A | Discharge status: Home / Self Care | Source: Ambulatory Visit | Attending: Emergency Medicine | Admitting: Emergency Medicine

## 2024-02-14 ENCOUNTER — Emergency Department

## 2024-02-14 ENCOUNTER — Encounter: Payer: Self-pay | Admitting: Emergency Medicine

## 2024-02-14 DIAGNOSIS — I10 Essential (primary) hypertension: Secondary | ICD-10-CM | POA: Diagnosis not present

## 2024-02-14 DIAGNOSIS — R519 Headache, unspecified: Secondary | ICD-10-CM

## 2024-02-14 DIAGNOSIS — R079 Chest pain, unspecified: Secondary | ICD-10-CM

## 2024-02-14 HISTORY — DX: Essential (primary) hypertension: I10

## 2024-02-14 LAB — CBC
HCT: 39.8 % (ref 36.0–46.0)
Hemoglobin: 12.7 g/dL (ref 12.0–15.0)
MCH: 27.1 pg (ref 26.0–34.0)
MCHC: 31.9 g/dL (ref 30.0–36.0)
MCV: 85 fL (ref 80.0–100.0)
Platelets: 288 10*3/uL (ref 150–400)
RBC: 4.68 MIL/uL (ref 3.87–5.11)
RDW: 14.2 % (ref 11.5–15.5)
WBC: 5 10*3/uL (ref 4.0–10.5)
nRBC: 0 % (ref 0.0–0.2)

## 2024-02-14 LAB — COMPREHENSIVE METABOLIC PANEL WITH GFR
ALT: 25 U/L (ref 0–44)
AST: 40 U/L (ref 15–41)
Albumin: 4.3 g/dL (ref 3.5–5.0)
Alkaline Phosphatase: 69 U/L (ref 38–126)
Anion gap: 12 (ref 5–15)
BUN: 11 mg/dL (ref 8–23)
CO2: 29 mmol/L (ref 22–32)
Calcium: 9.4 mg/dL (ref 8.9–10.3)
Chloride: 99 mmol/L (ref 98–111)
Creatinine, Ser: 0.87 mg/dL (ref 0.44–1.00)
GFR, Estimated: >60 mL/min (ref >60)
Glucose, Bld: 147 mg/dL — ABNORMAL HIGH (ref 70–99)
Potassium: 3.5 mmol/L (ref 3.5–5.1)
Sodium: 140 mmol/L (ref 135–145)
Total Bilirubin: 1 mg/dL (ref 0.0–1.2)
Total Protein: 7.7 g/dL (ref 6.5–8.1)

## 2024-02-14 LAB — TROPONIN I (HIGH SENSITIVITY): Troponin I (High Sensitivity): 4 ng/L (ref <18)

## 2024-02-14 MED ORDER — AMLODIPINE BESYLATE 5 MG PO TABS: 5.0000 mg | ORAL_TABLET | Freq: Every day | ORAL | 2 refills | Status: DC

## 2024-02-14 MED ORDER — CLONIDINE HCL 0.1 MG PO TABS
0.1000 mg | ORAL_TABLET | Freq: Once | ORAL | Status: AC
  Administered 2024-02-14: 0.1 mg via ORAL
  Filled 2024-02-14: qty 1

## 2024-02-14 NOTE — ED Provider Notes (Signed)
 [facility] Provider Note    Event Date/Time   First MD Initiated Contact with Patient 02/14/24 [phone]     (approximate)   History   Hypertension   HPI  Deanna Fuller is a 63 year old female with history of hypertension presenting to the emergency department for evaluation of elevated blood pressure.  Patient had her routine follow-up with her primary care doctor today.  There, she was noted to have an initial blood pressure of 253/116, improved but remained elevated on repeat.  She was sent to the ER for further evaluation.  She does report that she was without her medication for a while, but has been taking over the past few months.  She additionally notes that over the past few months she has had an intermittent headache, not sudden in onset.  No associated numbness, tingling, focal weakness, vision changes.  On Sunday, she had a brief episode of chest discomfort that she thought may be related to indigestion, now resolved.     Physical Exam   Triage Vital Signs: ED Triage Vitals  Encounter Vitals Group     BP 02/14/24 0943 (!) 191/109     Girls Systolic BP Percentile --      Girls Diastolic BP Percentile --      Boys Systolic BP Percentile --      Boys Diastolic BP Percentile --      Pulse Rate 02/14/24 0943 83     Resp 02/14/24 0943 16     Temp 02/14/24 0943 98.5 F (36.9 C)     Temp Source 02/14/24 0943 Oral     SpO2 02/14/24 0943 98 %     Weight 02/14/24 0951 189 lb (85.7 kg)     Height 02/14/24 0951 5' 8 (1.727 m)     Head Circumference --      Peak Flow --      Pain Score 02/14/24 0950 8     Pain Loc --      Pain Education --      Exclude from Growth Chart --     Most recent vital signs: Vitals:   02/14/24 0943 02/14/24 1139  BP: (!) 191/109 135/86  Pulse: 83 80  Resp: 16 18  Temp: 98.5 F (36.9 C) 98.4 F (36.9 C)  SpO2: 98% 100%     General: Awake, interactive  CV:  Regular rate, good peripheral perfusion.   Resp:  Unlabored respirations, lungs clear to auscultation Abd:  Nondistended.  Neuro:  Alert and oriented, normal extraocular movements, symmetric facial movement, sensation intact over bilateral upper and lower extremities with 5 out of 5 strength.  Normal finger-to-nose testing.   ED Results / Procedures / Treatments   Labs (all labs ordered are listed, but only abnormal results are displayed) Labs Reviewed  COMPREHENSIVE METABOLIC PANEL WITH GFR - Abnormal; Notable for the following components:      Result Value   Glucose, Bld 147 (*)    All other components within normal limits  CBC  TROPONIN I (HIGH SENSITIVITY)     EKG EKG independently reviewed and interpreted by myself demonstrates:  EKG demonstrates normal sinus rhythm rate of 78, PR 166, cures 72, QTc 446, no acute ST changes  RADIOLOGY Imaging independently reviewed and interpreted by myself demonstrates:  CXR without obvious focal consolidation, radiology notes questionable opacity, patient without clinical evidence of pneumonia CT head without acute bleed  Formal Radiology Read:  CT Head Wo Contrast Result Date: 02/14/2024 CLINICAL DATA:  63 year old female is hypertensive, systolic 253. Headache. EXAM: CT HEAD WITHOUT CONTRAST TECHNIQUE: Contiguous axial images were obtained from the base of the skull through the vertex without intravenous contrast. RADIATION DOSE REDUCTION: This exam was performed according to the departmental dose-optimization program which includes automated exposure control, adjustment of the mA and/or kV according to patient size and/or use of iterative reconstruction technique. COMPARISON:  Brain MRI 09/15/2022. FINDINGS: Brain: Stable cerebral volume, within normal limits for age. No midline shift, ventriculomegaly, mass effect, evidence of mass lesion, intracranial hemorrhage or evidence of cortically based acute infarction. Patchy periatrial white matter hypodensity, similar to the previous MRI.  Otherwise maintained gray-white differentiation. Vascular: No suspicious intracranial vascular hyperdensity. Mild Calcified atherosclerosis at the skull base. Mild bilateral basal ganglia vascular calcifications. Skull: Intact.  No acute osseous abnormality identified. Sinuses/Orbits: Visualized paranasal sinuses and mastoids are clear. Other: Visualized orbits and scalp soft tissues are within normal limits. IMPRESSION: 1. No acute intracranial abnormality. 2. Moderate for age chronic cerebral white matter changes, most commonly due to small vessel disease. Electronically Signed   By: VEAR Hurst M.D.   On: 02/14/2024 10:39   DG Chest 2 View Result Date: 02/14/2024 CLINICAL DATA:  Chest pain EXAM: CHEST - 2 VIEW COMPARISON:  None Available. FINDINGS: Midline trachea. Mild cardiomegaly. Tortuous thoracic aorta. No pleural effusion or pneumothorax. Mild clear right lung. There is minimal increased density along the left heart border, likely corresponding to increased anterior density on the lateral view. IMPRESSION: No acute process or explanation for chest pain. Minimal opacity within the lingula is most likely scarring or atelectasis. If early or mild pneumonia is a clinical concern, consider radiographic follow-up at 5-7 days. Cardiomegaly without congestive failure. Electronically Signed   By: Rockey Kilts M.D.   On: 02/14/2024 10:31    PROCEDURES:  Critical Care performed: No  Procedures   MEDICATIONS ORDERED IN ED: Medications  cloNIDine (CATAPRES) tablet 0.1 mg (0.1 mg Oral Given 02/14/24 1023)     IMPRESSION / MDM / ASSESSMENT AND PLAN / ED COURSE  I reviewed the triage vital signs and the nursing notes.  Differential diagnosis includes, but is not limited to, lower suspicion but consideration for hypertensive emergency including intracranial bleed, cardiac strain, pulmonary edema, suspect likely hypertensive urgency related to medication nonadherence, possible undertreatment  Patient's  presentation is most consistent with acute presentation with potential threat to life or bodily function.  63 year old female presenting with elevated blood pressure.  Will obtain labs, EKG, imaging to further evaluate.   Labs reassuring including CBC, CMP, negative troponin.  EKG without acute ischemic changes.  X-Kielan Dreisbach without pulmonary edema or other acute findings.  CT head reassuring.  Patient reassessed.  No new complaints.  Updated on results of workup.  Significant improvement in blood pressure following this.  Patient does note that her primary care doctor was thinking about adding amlodipine for an adequate blood pressure control.  Will go ahead and send a prescription for this.  Patient will keep a log of her blood pressure and arrange follow-up with her primary care doctor.  Strict return precautions provided.  Patient discharged in stable condition.      FINAL CLINICAL IMPRESSION(S) / ED DIAGNOSES   Final diagnoses:  Acute nonintractable headache, unspecified headache type  Nonspecific chest pain  Uncontrolled hypertension     Rx / DC Orders   ED Discharge Orders          Ordered    amLODipine (NORVASC) 5 MG tablet  Daily        02/14/24 1151             Note:  This document was prepared using Dragon voice recognition software and may include unintentional dictation errors.   Levander Slate, MD 02/14/24 [phone]

## 2024-02-14 NOTE — Discharge Instructions (Addendum)
 Continue to take your blood pressure medication as directed.  I sent a prescription for an additional medicine, amlodipine to your pharmacy.  Take this as directed.  Keep a log of your blood pressure and follow-up your primary care doctor for further evaluation.  Return to the ER for new or worsening symptoms.

## 2024-02-14 NOTE — ED Triage Notes (Signed)
 Patient to ED via POV from doctor's office for hypertension. PT report BP of 253/116- hx of same. Pt reports she does not always take medication regularly. States she does have a headache/lightheaded but denies vision changes.

## 2024-04-27 ENCOUNTER — Ambulatory Visit: Attending: Internal Medicine | Admitting: Internal Medicine

## 2024-04-27 ENCOUNTER — Ambulatory Visit

## 2024-04-27 ENCOUNTER — Encounter: Payer: Self-pay | Admitting: Internal Medicine

## 2024-04-27 VITALS — BP 144/90 | HR 73 | Ht 68.0 in | Wt 189.4 lb

## 2024-04-27 DIAGNOSIS — E1169 Type 2 diabetes mellitus with other specified complication: Secondary | ICD-10-CM

## 2024-04-27 DIAGNOSIS — R072 Precordial pain: Secondary | ICD-10-CM

## 2024-04-27 DIAGNOSIS — R002 Palpitations: Secondary | ICD-10-CM | POA: Diagnosis not present

## 2024-04-27 DIAGNOSIS — E785 Hyperlipidemia, unspecified: Secondary | ICD-10-CM

## 2024-04-27 DIAGNOSIS — R0789 Other chest pain: Secondary | ICD-10-CM

## 2024-04-27 DIAGNOSIS — I10 Essential (primary) hypertension: Secondary | ICD-10-CM | POA: Diagnosis not present

## 2024-04-27 MED ORDER — METOPROLOL TARTRATE 100 MG PO TABS: ORAL_TABLET | ORAL | 0 refills | Status: DC

## 2024-04-27 MED ORDER — ASPIRIN 81 MG PO TBEC: 81.0000 mg | DELAYED_RELEASE_TABLET | Freq: Every day | ORAL | Status: DC

## 2024-04-27 NOTE — Progress Notes (Unsigned)
 Cardiology Office Note:  .   Date:  04/29/2024  ID:  Deanna Fuller, DOB 1961/03/08, MRN [id number] PCP: Deanna Catheryn PARAS, FNP  [facility] Providers Cardiologist:  None     History of Present Illness: .   Deanna Fuller is a 63 y.o. female with history of hypertension and type 2 diabetes mellitus, referred for evaluation of chest pain and palpitations.  Deanna Fuller reports that she has a long history of palpitations.  On July 1, she also experienced pain in her right flank that radiated towards her chest, prompting her to go to the ED out of concern that she was having a stroke or mini heart attack.  Workup there was unrevealing.  She has continued to have intermittent chest pain, usually on the right side and worsened with deep inspiration.  She denies exertional chest pain.  She also endorses sporadic palpitations with heart rates on her smart watch reading in the 80s to 90s.  These episodes happen a few times a week and typically last 5 to 10 minutes.  She notes that she had been off of her a lot of medications until April, when she resumed her BP and DM medications.  She has also cut down on her alcohol consumption since her ED visit in July.  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  ROS: See HPI  Studies Reviewed: Deanna Fuller   EKG Interpretation Date/Time:  Friday April 27 2024 15:47:38 EDT Ventricular Rate:  73 PR Interval:  164 QRS Duration:  74 QT Interval:  410 QTC Calculation: 451 R Axis:   16  Text Interpretation: Normal sinus rhythm Normal ECG When compared with ECG of 14-Feb-2024 09:53, No significant change was found Confirmed by Telina Kleckley, Deanna [phone]) on 04/27/2024 3:50:50 PM    Risk Assessment/Calculations:         Physical Exam:   VS:  BP (!) 144/90 (BP Location: Left Arm, Patient Position: Sitting, Cuff Size: Normal)   Pulse 73   Ht 5' 8 (1.727 m)   Wt 189 lb 6  oz (85.9 kg)   SpO2 96%   BMI 28.79 kg/m    Wt Readings from Last 3 Encounters:  04/27/24 189 lb 6 oz (85.9 kg)  02/14/24 189 lb (85.7 kg)    General:  NAD. Neck: No JVD or HJR. Lungs: Clear to auscultation bilaterally without wheezes or crackles. Heart: Regular rate and rhythm without murmurs, rubs, or gallops. Abdomen: Soft, nontender, nondistended. Extremities: No lower extremity edema.  ASSESSMENT AND PLAN: .    Atypical chest pain: Chest pain seems to be worsened with deep inspiration and is predominantly right-sided, arguing against cardiac etiology.  Physical exam and EKG today are unremarkable.  Recent ED workup was also unrevealing.  We have discussed further evaluation options and have agreed to obtain a coronary CTA given her multiple cardiac risk factors including hypertension, type 2 diabetes mellitus, and tobacco use.  We have also agreed to initiate aspirin  81 mg daily.  Palpitations: Etiology uncertain.  We will obtain a 14-day event monitor for further assessment.  Hypertension: Blood pressure moderately elevated today.  We have agreed to defer medication changes in favor of lifestyle modifications.  If blood pressure remains above 130/80 at follow-up, escalation of blood pressure regimen will need to be considered; would favor increasing losartan dose as next step.  Hyperlipidemia associated with type 2 diabetes mellitus: Continue  atorvastatin 20 mg daily pending coronary CTA.  If evidence of ASCVD, will need to escalate statin therapy for target LDL less than 70.  Ongoing management of DM per PCP.    Dispo: Return to clinic in 6 weeks.  Signed, Deanna Hanson, MD

## 2024-04-27 NOTE — Patient Instructions (Addendum)
 Medication Instructions:  Your physician recommends the following medication changes.  START TAKING: Aspirin  81 mg daily  *If you need a refill on your cardiac medications before your next appointment, please call your pharmacy*  Lab Work: Your provider would like for you to have following labs drawn today BMeT in the medical mall.   If you have labs (blood work) drawn today and your tests are completely normal, you will receive your results only by: MyChart Message (if you have MyChart) OR A paper copy in the mail If you have any lab test that is abnormal or we need to change your treatment, we will call you to review the results.  Testing/Procedures: Deanna Fuller- Long Term Monitor Instructions  Your physician has requested you wear a ZIO patch monitor for 14 days.  This is a single patch monitor. [employer] supplies one patch monitor per enrollment. Additional stickers are not available. Please do not apply patch if you will be having a Nuclear Stress Test, Echocardiogram, Cardiac CT, MRI, or Chest Xray during the period you would be wearing the monitor. The patch cannot be worn during these tests. You cannot remove and re-apply the ZIO XT patch monitor.  Your ZIO patch monitor will be mailed 3 day USPS to your address on file. It may take 3-5 days to receive your monitor after you have been enrolled. Once you have received your monitor, please review the enclosed instructions. Your monitor has already been registered assigning a specific monitor serial number to you.  Billing and Patient Assistance Program Information  We have supplied [employer] with any of your insurance information on file for billing purposes.  [employer] offers a sliding scale Patient Assistance Program for patients that do not have insurance, or whose insurance does not completely cover the cost of the ZIO monitor.  You must apply for the Patient Assistance Program to qualify for this discounted rate.  To apply, please call  [employer] at [phone], select option 4, select option 2, ask to apply for Patient Assistance Program. Meredeth will ask your household income, and how many people are in your household. They will quote your out-of-pocket cost based on that information. [employer] will also be able to set up a 7-month, interest-free payment plan if needed.  Applying the monitor   Shave hair from upper left chest.  Hold abrader disc by orange tab. Rub abrader in 40 strokes over the upper left chest as indicated in your monitor instructions.  Clean area with 4 enclosed alcohol pads. Let dry.  Apply patch as indicated in monitor instructions. Patch will be placed under collarbone on left side of chest with arrow pointing upward.  Rub patch adhesive wings for 2 minutes. Remove white label marked 1. Remove the white label marked 2. Rub patch adhesive wings for 2 additional minutes.  While looking in a mirror, press and release button in center of patch. A small green light will flash 3-4 times. This will be your only indicator that the monitor has been turned on.  Do not shower for the first 24 hours. You may shower after the first 24 hours.  Press the button if you feel a symptom. You will hear a small click. Record Date, Time and Symptom in the Patient Logbook.  When you are ready to remove the patch, follow instructions on the last 2 pages of Patient Logbook.  Stick patch monitor into the tabs at the bottom of the return box.  Place Patient Logbook in the blue and white  box. Use locking tab on box and tape box closed securely. The blue and white box has prepaid postage on it. Please place it in the mailbox as soon as possible. Your physician should have your test results approximately 7-14 days after the monitor has been mailed back to [facility].  Call [facility] Customer Care at [phone] if you have questions regarding your ZIO XT patch monitor.  Call them immediately if you see an orange light  blinking on your monitor.  If your monitor falls off in less than 4 days, contact our Monitor department at [phone].  If your monitor becomes loose or falls off after 4 days call [employer] at [phone] for suggestions on securing your monitor.     Your cardiac CT will be scheduled at the below locations:   [facility] [address] [place], [place] [address] [phone]  If scheduled at [facility], please arrive 15 mins early for check-in and test prep.  Please follow these instructions carefully (unless otherwise directed):  An IV will be required for this test and Nitroglycerin will be given.  Hold all erectile dysfunction medications at least 3 days (72 hrs) prior to test. (Ie viagra, cialis, sildenafil, tadalafil, etc)   On the Night Before the Test: Be sure to Drink plenty of water. Do not consume any caffeinated/decaffeinated beverages or chocolate 12 hours prior to your test. Do not take any antihistamines 12 hours prior to your test.  On the Day of the Test: Drink plenty of water until 1 hour prior to the test. Do not eat any food 1 hour prior to test. You may take your regular medications prior to the test.  Take metoprolol  (Lopressor ) two hours prior to test. If you take Furosemide/Hydrochlorothiazide/Spironolactone/Chlorthalidone, please HOLD on the morning of the test. Patients who wear a continuous glucose monitor MUST remove the device prior to scanning. FEMALES- please wear underwire-free bra if available, avoid dresses & tight clothing      After the Test: Drink plenty of water. After receiving IV contrast, you may experience a mild flushed feeling. This is normal. On occasion, you may experience a mild rash up to 24 hours after the test. This is not dangerous. If this occurs, you can take Benadryl 25 mg, Zyrtec, Claritin, or Allegra and increase your fluid intake. (Patients taking Tikosyn should avoid  Benadryl, and may take Zyrtec, Claritin, or Allegra) If you experience trouble breathing, this can be serious. If it is severe call 911 IMMEDIATELY. If it is mild, please call our office.  We will call to schedule your test 2-4 weeks out understanding that some insurance companies will need an authorization prior to the service being performed.   For more information and frequently asked questions, please visit our website : [email or web address]  For non-scheduling related questions, please contact the cardiac imaging nurse navigator should you have any questions/concerns: Cardiac Imaging Nurse Navigators Direct Office Dial: [phone]   For scheduling needs, including cancellations and rescheduling, please call [place], [phone].   Follow-Up: At [facility], you and your health needs are our priority.  As part of our continuing mission to provide you with exceptional heart care, our providers are all part of one team.  This team includes your primary Cardiologist (physician) and Advanced Practice Providers or APPs (Physician Assistants and Nurse Practitioners) who all work together to provide you with the care you need, when you need it.  Your next appointment:   6 Weeks  Provider:  You may see Dr Lonni Hanson, MD or one of the following Advanced Practice Providers on your designated Care Team:   Lonni Meager, NP Lesley Maffucci, PA-C Bernardino Bring, PA-C Cadence Mansfield, PA-C Tylene Lunch, NP Barnie Hila, NP

## 2024-04-29 ENCOUNTER — Encounter: Payer: Self-pay | Admitting: Internal Medicine

## 2024-04-29 DIAGNOSIS — R0789 Other chest pain: Secondary | ICD-10-CM | POA: Insufficient documentation

## 2024-04-29 DIAGNOSIS — R002 Palpitations: Secondary | ICD-10-CM | POA: Insufficient documentation

## 2024-04-29 DIAGNOSIS — I10 Essential (primary) hypertension: Secondary | ICD-10-CM | POA: Insufficient documentation

## 2024-04-29 DIAGNOSIS — E1169 Type 2 diabetes mellitus with other specified complication: Secondary | ICD-10-CM | POA: Insufficient documentation

## 2024-04-30 ENCOUNTER — Other Ambulatory Visit
Admission: RE | Admit: 2024-04-30 | Discharge: 2024-04-30 | Disposition: A | Discharge status: Home / Self Care | Attending: Internal Medicine | Admitting: Internal Medicine

## 2024-04-30 DIAGNOSIS — R002 Palpitations: Secondary | ICD-10-CM | POA: Insufficient documentation

## 2024-04-30 DIAGNOSIS — R0789 Other chest pain: Secondary | ICD-10-CM | POA: Diagnosis present

## 2024-04-30 LAB — BASIC METABOLIC PANEL WITH GFR
Anion gap: 12 (ref 5–15)
BUN: 17 mg/dL (ref 8–23)
CO2: 27 mmol/L (ref 22–32)
Calcium: 9.4 mg/dL (ref 8.9–10.3)
Chloride: 99 mmol/L (ref 98–111)
Creatinine, Ser: 1.06 mg/dL — ABNORMAL HIGH (ref 0.44–1.00)
GFR, Estimated: 59 mL/min — ABNORMAL LOW (ref >60)
Glucose, Bld: 92 mg/dL (ref 70–99)
Potassium: 3.8 mmol/L (ref 3.5–5.1)
Sodium: 138 mmol/L (ref 135–145)

## 2024-05-01 ENCOUNTER — Ambulatory Visit: Payer: Self-pay | Admitting: Internal Medicine

## 2024-05-28 ENCOUNTER — Encounter (HOSPITAL_COMMUNITY): Payer: Self-pay

## 2024-05-28 DIAGNOSIS — R072 Precordial pain: Secondary | ICD-10-CM | POA: Diagnosis not present

## 2024-05-28 DIAGNOSIS — R002 Palpitations: Secondary | ICD-10-CM

## 2024-05-30 ENCOUNTER — Ambulatory Visit (HOSPITAL_COMMUNITY)
Admission: RE | Admit: 2024-05-30 | Discharge: 2024-05-30 | Disposition: A | Discharge status: Home / Self Care | Source: Ambulatory Visit | Attending: Internal Medicine | Admitting: Internal Medicine

## 2024-05-30 DIAGNOSIS — R0789 Other chest pain: Secondary | ICD-10-CM | POA: Insufficient documentation

## 2024-05-30 DIAGNOSIS — R002 Palpitations: Secondary | ICD-10-CM | POA: Insufficient documentation

## 2024-05-30 MED ORDER — IOHEXOL 350 MG/ML SOLN
100.0000 mL | Freq: Once | INTRAVENOUS | Status: AC | PRN
  Administered 2024-05-30: 100 mL via INTRAVENOUS

## 2024-05-30 MED ORDER — NITROGLYCERIN 0.4 MG SL SUBL
0.8000 mg | SUBLINGUAL_TABLET | Freq: Once | SUBLINGUAL | Status: AC
  Administered 2024-05-30: 0.8 mg via SUBLINGUAL

## 2024-06-04 NOTE — Telephone Encounter (Signed)
Patient returned RN's call regarding test results.

## 2024-06-09 NOTE — Progress Notes (Unsigned)
 Cardiology Clinic Note   Date: 06/11/2024 ID: Lai Hendriks, DOB [birth year], MRN [id number]  Primary Cardiologist:  Lonni Hanson, MD  Chief Complaint   Deanna Fuller is a 63 y.o. female who presents to the clinic today for follow up after testing.   Patient Profile   Deanna Fuller is followed by Dr. Hanson for the history outlined below.      Past medical history significant for: Chest pain. Coronary CTA 05/30/2024: Coronary calcium score of 0 with no evidence of CAD.  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  Patent foramen ovale. Palpitations. 14-day ZIO 05/21/2024: HR 51 to 137 bpm, average 83 bpm.  Rare PACs/PVCs.  3 runs of SVT lasting up to 11 beats max rate 197 bpm.  Patient triggered events corresponded to sinus rhythm, PACs/PVCs. Hypertension. Hyperlipidemia. Lipid panel 11/30/2023: LDL 83, HDL 81, TG 202, total 197.  T2DM.  In summary, patient was first evaluated by Dr. Hanson on 04/27/2024 for chest pain and palpitations.  She underwent ED evaluation on 02/14/2024 for right flank pain radiating to chest.  Workup was unremarkable.  She continued to have intermittent chest pain usually on the right side worsened with deep inspiration.  She denied exertional chest pain.  She also reported sporadic palpitations with heart rate on her smart watch reading in the 80s and 90s.  Episodes were occurring a few times a week and typically lasting 5 to 10 minutes.  She reported cutting down on alcohol consumption since ED visit.  14-day ZIO demonstrated 3 runs of SVT and rare ectopy.  Coronary CTA demonstrated calcium score of 0 with incidental finding of patent foramen ovale.  Patient was instructed to stop aspirin  given results.     History of Present Illness    Today, patient reports continued rare episodes of right sided chest pain she describes as a bruised feeling worsened with deep breath unchanged from previous. She works at [employer]. She does a lot of  walking at work and lifting of no more than 20 lb secondary to bilateral shoulder replacements. She reports occasional palpitations typically at night when she is laying down to watch TV or go to sleep. She denies shortness of breath. She does not participate in regular exercise outside of walking at work. Discussed results of coronary CTA and Zio monitor in detail. All questions answered.     ROS: All other systems reviewed and are otherwise negative except as noted in History of Present Illness.  EKGs/Labs Reviewed        02/14/2024: ALT 25; AST 40 04/30/2024: BUN 17; Creatinine, Ser 1.06; Potassium 3.8; Sodium 138   02/14/2024: Hemoglobin 12.7; WBC 5.0    Physical Exam    VS:  BP 130/82 (BP Location: Left Arm, Patient Position: Sitting, Cuff Size: Normal)   Pulse 78   Ht 5' 8 (1.727 m)   Wt 193 lb (87.5 kg)   SpO2 99%   BMI 29.35 kg/m  , BMI Body mass index is 29.35 kg/m.  GEN: Well nourished, well developed, in no acute distress. Neck: No JVD or carotid bruits. Cardiac:  RRR.  No murmur. No rubs or gallops.   Respiratory:  Respirations regular and unlabored. Clear to auscultation without rales, wheezing or rhonchi. GI: Soft, nontender, nondistended. Extremities: Radials/DP/PT 2+ and equal bilaterally. No clubbing or cyanosis. No edema   Skin: Warm and dry, no rash. Neuro: Strength intact.  Assessment & Plan   Chest pain Coronary CTA October 2025 demonstrated calcium score  of 0 with no evidence of CAD, myocardial bridging within the proximal to mid LAD segment, patent foramen ovale.  Patient reports continued rare episodes of right sided chest pain described as a bruised feeling worsened with deep breathing.  - Continue amlodipine .  Palpitations 14-day ZIO October 2025 demonstrated HR 51 to 137 bpm, average 83 bpm, rare PACs/PVCs, 3 runs of SVT lasting up to 11 beats max rate 197 bpm.  Patient reports continued palpitations typically at night when she is laying down to watch  TV or go to bed. She attributes this to anxiety and was recently started on a new medication for anxiety.  - Continue to monitor. If palpitations become persistent could consider the addition of propranolol.  Hypertension BP today 138/86 on intake and 130/82 on my recheck. Home BP prior to medication was 145/89 this morning.  - Continue amlodipine , losartan. - Continue to follow with PCP.   Hyperlipidemia LDL 83 April 2024.  - Continue atorvastatin.  - Continue to follow with PCP.   Disposition: Return in 6 months or sooner as needed.          Signed, Barnie HERO. Mahkai Fangman, DNP, NP-C

## 2024-06-11 ENCOUNTER — Encounter: Payer: Self-pay | Admitting: Student

## 2024-06-11 ENCOUNTER — Ambulatory Visit: Attending: Student | Admitting: Student

## 2024-06-11 VITALS — BP 130/82 | HR 78 | Ht 68.0 in | Wt 193.0 lb

## 2024-06-11 DIAGNOSIS — R002 Palpitations: Secondary | ICD-10-CM

## 2024-06-11 DIAGNOSIS — R0789 Other chest pain: Secondary | ICD-10-CM | POA: Diagnosis not present

## 2024-06-11 DIAGNOSIS — E7800 Pure hypercholesterolemia, unspecified: Secondary | ICD-10-CM

## 2024-06-11 DIAGNOSIS — I10 Essential (primary) hypertension: Secondary | ICD-10-CM | POA: Diagnosis not present

## 2024-06-11 NOTE — Patient Instructions (Signed)
 Medication Instructions:   Your physician recommends that you continue on your current medications as directed. Please refer to the Current Medication list given to you today.   *If you need a refill on your cardiac medications before your next appointment, please call your pharmacy*  Lab Work:  No labs ordered today   If you have labs (blood work) drawn today and your tests are completely normal, you will receive your results only by: MyChart Message (if you have MyChart) OR A paper copy in the mail If you have any lab test that is abnormal or we need to change your treatment, we will call you to review the results.  Testing/Procedures:  No test ordered today   Follow-Up: At [facility], you and your health needs are our priority.  As part of our continuing mission to provide you with exceptional heart care, our providers are all part of one team.  This team includes your primary Cardiologist (physician) and Advanced Practice Providers or APPs (Physician Assistants and Nurse Practitioners) who all work together to provide you with the care you need, when you need it.  Your next appointment:    6 month(s)  Provider:   You may see Lonni Hanson, MD or one of the following Advanced Practice Providers on your designated Care Team:    Barnie Hila, NP   We recommend signing up for the patient portal called MyChart.  Sign up information is provided on this After Visit Summary.  MyChart is used to connect with patients for Virtual Visits (Telemedicine).  Patients are able to view lab/test results, encounter notes, upcoming appointments, etc.  Non-urgent messages can be sent to your provider as well.   To learn more about what you can do with MyChart, go to [email or web address].
